# Patient Record
Sex: Female | Born: 1985
Health system: Southern US, Community
[De-identification: ages and names within clinical notes are randomized; demographics above are authoritative.]

---
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
## 2003-03-13 ENCOUNTER — Ambulatory Visit (HOSPITAL_COMMUNITY): Admission: RE | Admit: 2003-03-13 | Discharge: 2003-03-13 | Payer: Self-pay | Admitting: Neurosurgery

## 2003-03-13 ENCOUNTER — Encounter: Payer: Self-pay | Admitting: Neurosurgery

## 2003-04-18 ENCOUNTER — Ambulatory Visit (HOSPITAL_COMMUNITY): Admission: RE | Admit: 2003-04-18 | Discharge: 2003-04-18 | Payer: Self-pay | Admitting: Neurology

## 2003-04-18 ENCOUNTER — Encounter: Payer: Self-pay | Admitting: Neurology

## 2003-04-24 ENCOUNTER — Other Ambulatory Visit: Admission: RE | Admit: 2003-04-24 | Discharge: 2003-04-24 | Payer: Self-pay | Admitting: Family Medicine

## 2004-07-25 ENCOUNTER — Other Ambulatory Visit: Admission: RE | Admit: 2004-07-25 | Discharge: 2004-07-25 | Payer: Self-pay | Admitting: Family Medicine

## 2005-08-11 ENCOUNTER — Encounter: Admission: RE | Admit: 2005-08-11 | Discharge: 2005-08-11 | Payer: Self-pay | Admitting: Family Medicine

## 2006-03-05 ENCOUNTER — Ambulatory Visit (HOSPITAL_BASED_OUTPATIENT_CLINIC_OR_DEPARTMENT_OTHER): Admission: RE | Admit: 2006-03-05 | Discharge: 2006-03-05 | Payer: Self-pay | Admitting: Orthopedic Surgery

## 2006-03-23 ENCOUNTER — Emergency Department (HOSPITAL_COMMUNITY): Admission: EM | Admit: 2006-03-23 | Discharge: 2006-03-23 | Payer: Self-pay | Admitting: Emergency Medicine

## 2010-12-07 ENCOUNTER — Encounter: Payer: Self-pay | Admitting: Family Medicine

## 2018-01-18 DIAGNOSIS — M25562 Pain in left knee: Secondary | ICD-10-CM | POA: Diagnosis not present

## 2018-01-18 DIAGNOSIS — Z4889 Encounter for other specified surgical aftercare: Secondary | ICD-10-CM | POA: Diagnosis not present

## 2018-01-18 DIAGNOSIS — G8929 Other chronic pain: Secondary | ICD-10-CM | POA: Diagnosis not present

## 2018-01-18 DIAGNOSIS — M25662 Stiffness of left knee, not elsewhere classified: Secondary | ICD-10-CM | POA: Diagnosis not present

## 2018-01-18 DIAGNOSIS — M6281 Muscle weakness (generalized): Secondary | ICD-10-CM | POA: Diagnosis not present

## 2018-01-21 DIAGNOSIS — M6281 Muscle weakness (generalized): Secondary | ICD-10-CM | POA: Diagnosis not present

## 2018-01-21 DIAGNOSIS — G8929 Other chronic pain: Secondary | ICD-10-CM | POA: Diagnosis not present

## 2018-01-21 DIAGNOSIS — M25662 Stiffness of left knee, not elsewhere classified: Secondary | ICD-10-CM | POA: Diagnosis not present

## 2018-01-21 DIAGNOSIS — M25562 Pain in left knee: Secondary | ICD-10-CM | POA: Diagnosis not present

## 2018-01-21 DIAGNOSIS — Z4889 Encounter for other specified surgical aftercare: Secondary | ICD-10-CM | POA: Diagnosis not present

## 2018-01-24 DIAGNOSIS — Z4889 Encounter for other specified surgical aftercare: Secondary | ICD-10-CM | POA: Diagnosis not present

## 2018-01-24 DIAGNOSIS — G8929 Other chronic pain: Secondary | ICD-10-CM | POA: Diagnosis not present

## 2018-01-24 DIAGNOSIS — M25662 Stiffness of left knee, not elsewhere classified: Secondary | ICD-10-CM | POA: Diagnosis not present

## 2018-01-24 DIAGNOSIS — M25562 Pain in left knee: Secondary | ICD-10-CM | POA: Diagnosis not present

## 2018-01-24 DIAGNOSIS — M6281 Muscle weakness (generalized): Secondary | ICD-10-CM | POA: Diagnosis not present

## 2018-01-27 DIAGNOSIS — J069 Acute upper respiratory infection, unspecified: Secondary | ICD-10-CM | POA: Diagnosis not present

## 2018-01-28 DIAGNOSIS — M6281 Muscle weakness (generalized): Secondary | ICD-10-CM | POA: Diagnosis not present

## 2018-01-28 DIAGNOSIS — Z4889 Encounter for other specified surgical aftercare: Secondary | ICD-10-CM | POA: Diagnosis not present

## 2018-01-28 DIAGNOSIS — M25562 Pain in left knee: Secondary | ICD-10-CM | POA: Diagnosis not present

## 2018-01-28 DIAGNOSIS — G8929 Other chronic pain: Secondary | ICD-10-CM | POA: Diagnosis not present

## 2018-01-28 DIAGNOSIS — M25662 Stiffness of left knee, not elsewhere classified: Secondary | ICD-10-CM | POA: Diagnosis not present

## 2018-01-30 DIAGNOSIS — J0110 Acute frontal sinusitis, unspecified: Secondary | ICD-10-CM | POA: Diagnosis not present

## 2018-01-31 DIAGNOSIS — M1712 Unilateral primary osteoarthritis, left knee: Secondary | ICD-10-CM | POA: Diagnosis not present

## 2018-02-01 DIAGNOSIS — M25662 Stiffness of left knee, not elsewhere classified: Secondary | ICD-10-CM | POA: Diagnosis not present

## 2018-02-01 DIAGNOSIS — M6281 Muscle weakness (generalized): Secondary | ICD-10-CM | POA: Diagnosis not present

## 2018-02-01 DIAGNOSIS — Z4889 Encounter for other specified surgical aftercare: Secondary | ICD-10-CM | POA: Diagnosis not present

## 2018-02-01 DIAGNOSIS — M25562 Pain in left knee: Secondary | ICD-10-CM | POA: Diagnosis not present

## 2018-02-01 DIAGNOSIS — G8929 Other chronic pain: Secondary | ICD-10-CM | POA: Diagnosis not present

## 2018-02-03 DIAGNOSIS — G90522 Complex regional pain syndrome I of left lower limb: Secondary | ICD-10-CM | POA: Diagnosis not present

## 2018-02-03 DIAGNOSIS — M1712 Unilateral primary osteoarthritis, left knee: Secondary | ICD-10-CM | POA: Diagnosis not present

## 2018-02-03 DIAGNOSIS — Z79891 Long term (current) use of opiate analgesic: Secondary | ICD-10-CM | POA: Diagnosis not present

## 2018-02-18 DIAGNOSIS — M6281 Muscle weakness (generalized): Secondary | ICD-10-CM | POA: Diagnosis not present

## 2018-02-18 DIAGNOSIS — Z4889 Encounter for other specified surgical aftercare: Secondary | ICD-10-CM | POA: Diagnosis not present

## 2018-02-18 DIAGNOSIS — M25562 Pain in left knee: Secondary | ICD-10-CM | POA: Diagnosis not present

## 2018-02-18 DIAGNOSIS — M25662 Stiffness of left knee, not elsewhere classified: Secondary | ICD-10-CM | POA: Diagnosis not present

## 2018-02-18 DIAGNOSIS — G8929 Other chronic pain: Secondary | ICD-10-CM | POA: Diagnosis not present

## 2018-03-14 DIAGNOSIS — G8929 Other chronic pain: Secondary | ICD-10-CM | POA: Diagnosis not present

## 2018-03-14 DIAGNOSIS — M6281 Muscle weakness (generalized): Secondary | ICD-10-CM | POA: Diagnosis not present

## 2018-03-14 DIAGNOSIS — M8580 Other specified disorders of bone density and structure, unspecified site: Secondary | ICD-10-CM | POA: Diagnosis not present

## 2018-03-14 DIAGNOSIS — M25662 Stiffness of left knee, not elsewhere classified: Secondary | ICD-10-CM | POA: Diagnosis not present

## 2018-03-14 DIAGNOSIS — M25562 Pain in left knee: Secondary | ICD-10-CM | POA: Diagnosis not present

## 2018-03-14 DIAGNOSIS — M1712 Unilateral primary osteoarthritis, left knee: Secondary | ICD-10-CM | POA: Diagnosis not present

## 2018-03-14 DIAGNOSIS — Z96652 Presence of left artificial knee joint: Secondary | ICD-10-CM | POA: Diagnosis not present

## 2018-03-14 DIAGNOSIS — Z4889 Encounter for other specified surgical aftercare: Secondary | ICD-10-CM | POA: Diagnosis not present

## 2018-03-24 DIAGNOSIS — Z01419 Encounter for gynecological examination (general) (routine) without abnormal findings: Secondary | ICD-10-CM | POA: Diagnosis not present

## 2018-03-24 DIAGNOSIS — R87610 Atypical squamous cells of undetermined significance on cytologic smear of cervix (ASC-US): Secondary | ICD-10-CM | POA: Diagnosis not present

## 2018-03-24 DIAGNOSIS — R69 Illness, unspecified: Secondary | ICD-10-CM | POA: Diagnosis not present

## 2018-03-25 DIAGNOSIS — R030 Elevated blood-pressure reading, without diagnosis of hypertension: Secondary | ICD-10-CM | POA: Diagnosis not present

## 2018-03-25 DIAGNOSIS — Z01419 Encounter for gynecological examination (general) (routine) without abnormal findings: Secondary | ICD-10-CM | POA: Diagnosis not present

## 2018-03-25 DIAGNOSIS — Z124 Encounter for screening for malignant neoplasm of cervix: Secondary | ICD-10-CM | POA: Diagnosis not present

## 2018-03-25 DIAGNOSIS — Z3041 Encounter for surveillance of contraceptive pills: Secondary | ICD-10-CM | POA: Diagnosis not present

## 2018-03-25 DIAGNOSIS — R69 Illness, unspecified: Secondary | ICD-10-CM | POA: Diagnosis not present

## 2018-04-01 DIAGNOSIS — Z4889 Encounter for other specified surgical aftercare: Secondary | ICD-10-CM | POA: Diagnosis not present

## 2018-04-01 DIAGNOSIS — M25662 Stiffness of left knee, not elsewhere classified: Secondary | ICD-10-CM | POA: Diagnosis not present

## 2018-04-01 DIAGNOSIS — M6281 Muscle weakness (generalized): Secondary | ICD-10-CM | POA: Diagnosis not present

## 2018-04-01 DIAGNOSIS — G8929 Other chronic pain: Secondary | ICD-10-CM | POA: Diagnosis not present

## 2018-04-01 DIAGNOSIS — M25562 Pain in left knee: Secondary | ICD-10-CM | POA: Diagnosis not present

## 2018-04-04 DIAGNOSIS — M8580 Other specified disorders of bone density and structure, unspecified site: Secondary | ICD-10-CM | POA: Diagnosis not present

## 2018-04-04 DIAGNOSIS — Z96652 Presence of left artificial knee joint: Secondary | ICD-10-CM | POA: Diagnosis not present

## 2018-04-04 DIAGNOSIS — M1712 Unilateral primary osteoarthritis, left knee: Secondary | ICD-10-CM | POA: Diagnosis not present

## 2018-04-04 DIAGNOSIS — S86812S Strain of other muscle(s) and tendon(s) at lower leg level, left leg, sequela: Secondary | ICD-10-CM | POA: Diagnosis not present

## 2018-04-22 DIAGNOSIS — Z0000 Encounter for general adult medical examination without abnormal findings: Secondary | ICD-10-CM | POA: Diagnosis not present

## 2018-04-22 DIAGNOSIS — Z13220 Encounter for screening for lipoid disorders: Secondary | ICD-10-CM | POA: Diagnosis not present

## 2018-04-22 DIAGNOSIS — Z720 Tobacco use: Secondary | ICD-10-CM | POA: Diagnosis not present

## 2018-04-22 DIAGNOSIS — Z131 Encounter for screening for diabetes mellitus: Secondary | ICD-10-CM | POA: Diagnosis not present

## 2018-05-11 DIAGNOSIS — M1712 Unilateral primary osteoarthritis, left knee: Secondary | ICD-10-CM | POA: Diagnosis not present

## 2018-06-13 DIAGNOSIS — Z720 Tobacco use: Secondary | ICD-10-CM | POA: Diagnosis not present

## 2018-06-13 DIAGNOSIS — R69 Illness, unspecified: Secondary | ICD-10-CM | POA: Diagnosis not present

## 2018-06-13 DIAGNOSIS — F439 Reaction to severe stress, unspecified: Secondary | ICD-10-CM | POA: Diagnosis not present

## 2018-06-14 DIAGNOSIS — S86212A Strain of muscle(s) and tendon(s) of anterior muscle group at lower leg level, left leg, initial encounter: Secondary | ICD-10-CM | POA: Diagnosis not present

## 2018-06-14 DIAGNOSIS — M1712 Unilateral primary osteoarthritis, left knee: Secondary | ICD-10-CM | POA: Diagnosis not present

## 2018-06-14 DIAGNOSIS — M25562 Pain in left knee: Secondary | ICD-10-CM | POA: Diagnosis not present

## 2018-06-14 DIAGNOSIS — Z96652 Presence of left artificial knee joint: Secondary | ICD-10-CM | POA: Diagnosis not present

## 2018-06-15 DIAGNOSIS — M1712 Unilateral primary osteoarthritis, left knee: Secondary | ICD-10-CM | POA: Diagnosis not present

## 2018-06-15 DIAGNOSIS — M66252 Spontaneous rupture of extensor tendons, left thigh: Secondary | ICD-10-CM | POA: Diagnosis not present

## 2018-06-27 DIAGNOSIS — G90522 Complex regional pain syndrome I of left lower limb: Secondary | ICD-10-CM | POA: Diagnosis not present

## 2018-06-27 DIAGNOSIS — M1712 Unilateral primary osteoarthritis, left knee: Secondary | ICD-10-CM | POA: Diagnosis not present

## 2018-06-27 DIAGNOSIS — Z79891 Long term (current) use of opiate analgesic: Secondary | ICD-10-CM | POA: Diagnosis not present

## 2018-07-19 DIAGNOSIS — R69 Illness, unspecified: Secondary | ICD-10-CM | POA: Diagnosis not present

## 2018-07-19 DIAGNOSIS — Z720 Tobacco use: Secondary | ICD-10-CM | POA: Diagnosis not present

## 2018-07-19 DIAGNOSIS — F439 Reaction to severe stress, unspecified: Secondary | ICD-10-CM | POA: Diagnosis not present

## 2018-07-20 ENCOUNTER — Other Ambulatory Visit (HOSPITAL_BASED_OUTPATIENT_CLINIC_OR_DEPARTMENT_OTHER): Payer: Self-pay | Admitting: Physician Assistant

## 2018-07-20 DIAGNOSIS — S86812D Strain of other muscle(s) and tendon(s) at lower leg level, left leg, subsequent encounter: Secondary | ICD-10-CM

## 2018-07-23 ENCOUNTER — Ambulatory Visit (HOSPITAL_BASED_OUTPATIENT_CLINIC_OR_DEPARTMENT_OTHER)
Admission: RE | Admit: 2018-07-23 | Discharge: 2018-07-23 | Disposition: A | Discharge status: Home / Self Care | Payer: 59 | Source: Ambulatory Visit | Attending: Physician Assistant | Admitting: Physician Assistant

## 2018-07-23 DIAGNOSIS — Z9889 Other specified postprocedural states: Secondary | ICD-10-CM | POA: Diagnosis not present

## 2018-07-23 DIAGNOSIS — S86812D Strain of other muscle(s) and tendon(s) at lower leg level, left leg, subsequent encounter: Secondary | ICD-10-CM | POA: Diagnosis not present

## 2018-07-23 DIAGNOSIS — M25562 Pain in left knee: Secondary | ICD-10-CM | POA: Diagnosis not present

## 2018-08-08 DIAGNOSIS — G90522 Complex regional pain syndrome I of left lower limb: Secondary | ICD-10-CM | POA: Diagnosis not present

## 2018-08-08 DIAGNOSIS — Z79891 Long term (current) use of opiate analgesic: Secondary | ICD-10-CM | POA: Diagnosis not present

## 2018-08-08 DIAGNOSIS — M1712 Unilateral primary osteoarthritis, left knee: Secondary | ICD-10-CM | POA: Diagnosis not present

## 2018-09-05 DIAGNOSIS — Z79891 Long term (current) use of opiate analgesic: Secondary | ICD-10-CM | POA: Diagnosis not present

## 2018-09-05 DIAGNOSIS — M1712 Unilateral primary osteoarthritis, left knee: Secondary | ICD-10-CM | POA: Diagnosis not present

## 2018-09-05 DIAGNOSIS — G90522 Complex regional pain syndrome I of left lower limb: Secondary | ICD-10-CM | POA: Diagnosis not present

## 2018-09-29 DIAGNOSIS — M25562 Pain in left knee: Secondary | ICD-10-CM | POA: Diagnosis not present

## 2018-10-03 DIAGNOSIS — M25569 Pain in unspecified knee: Secondary | ICD-10-CM | POA: Diagnosis not present

## 2018-10-03 DIAGNOSIS — M25562 Pain in left knee: Secondary | ICD-10-CM | POA: Diagnosis not present

## 2018-10-27 DIAGNOSIS — T8484XA Pain due to internal orthopedic prosthetic devices, implants and grafts, initial encounter: Secondary | ICD-10-CM | POA: Diagnosis not present

## 2018-10-27 DIAGNOSIS — M25562 Pain in left knee: Secondary | ICD-10-CM | POA: Diagnosis not present

## 2018-12-23 DIAGNOSIS — J1008 Influenza due to other identified influenza virus with other specified pneumonia: Secondary | ICD-10-CM | POA: Diagnosis not present

## 2018-12-26 DIAGNOSIS — G90522 Complex regional pain syndrome I of left lower limb: Secondary | ICD-10-CM | POA: Diagnosis not present

## 2018-12-26 DIAGNOSIS — M1712 Unilateral primary osteoarthritis, left knee: Secondary | ICD-10-CM | POA: Diagnosis not present

## 2019-01-08 IMAGING — MR MR KNEE*L* W/O CM
6 series · 40 of 40 positions shown · non-contrast
Comparison: None available.

CLINICAL DATA: Chronic left knee pain. History of multiple knee
surgeries.

EXAM:
MRI OF THE LEFT KNEE WITHOUT CONTRAST
TECHNIQUE: Multiplanar, multisequence MR imaging of the knee was performed. No
intravenous contrast was administered.

[Series 5: T1 · axial · 4.0mm · 0.47mm/px · z∈[-100,+65]mm · 7 of 34 slices shown]
[im 1/34]
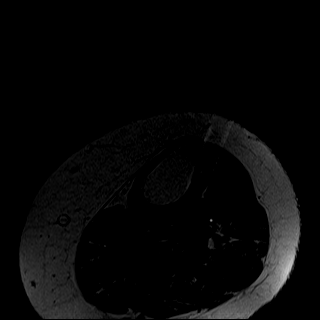
[im 6/34]
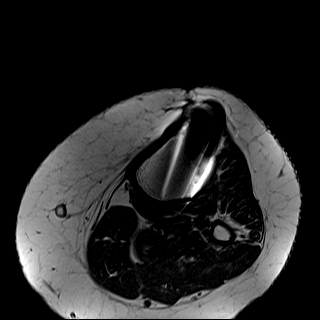
[im 12/34]
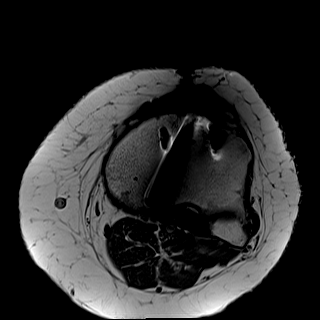
[im 17/34]
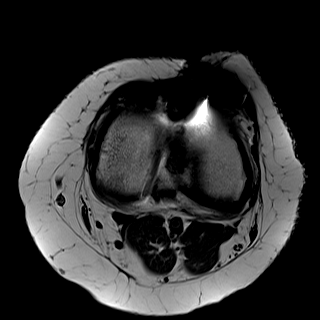
[im 23/34]
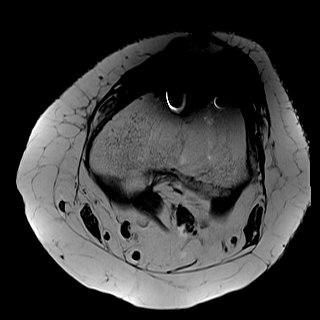
[im 28/34]
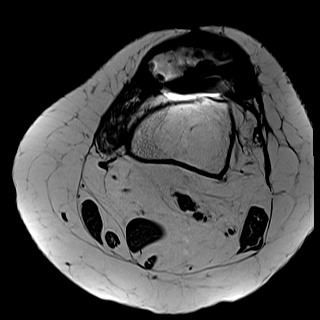
[im 34/34]
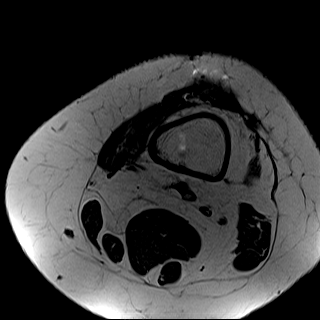

[Series 6: t2_tse_tra_high_bw · axial · 4.0mm · 0.39mm/px · z∈[-100,+65]mm · 7 of 34 slices shown]
[im 1/34]
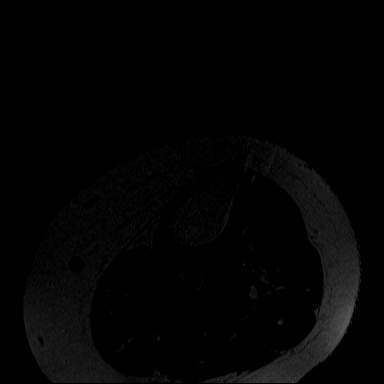
[im 6/34]
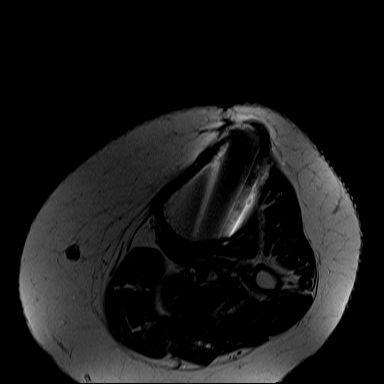
[im 12/34]
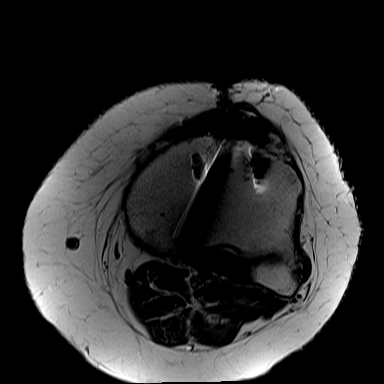
[im 17/34]
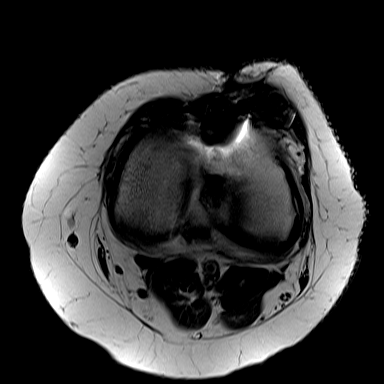
[im 23/34]
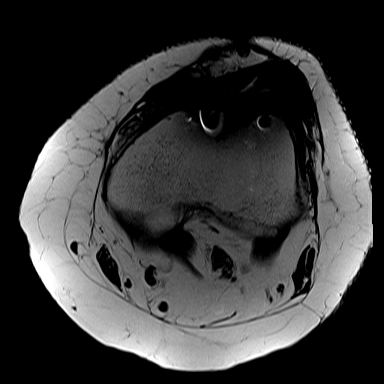
[im 28/34]
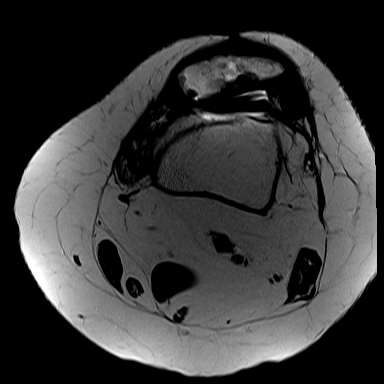
[im 34/34]
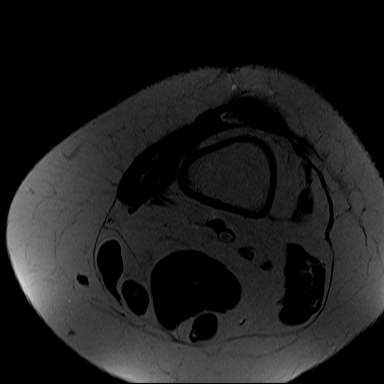

[Series 7: t1_tirm_cor_high_bw · coronal · 3.0mm · 0.53mm/px · 6 of 28 slices shown]
[im 1/28]
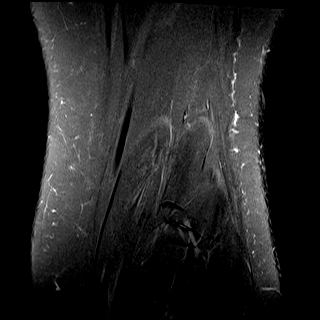
[im 6/28]
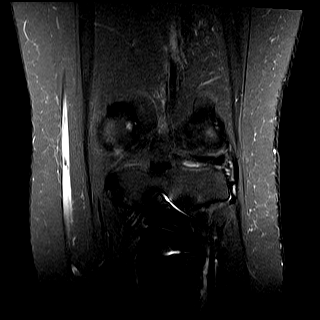
[im 11/28]
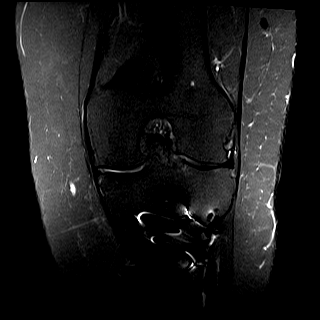
[im 17/28]
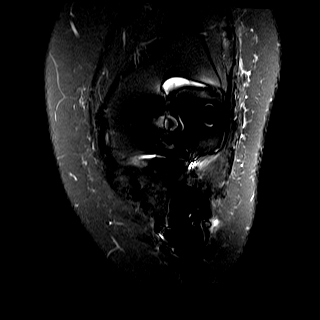
[im 22/28]
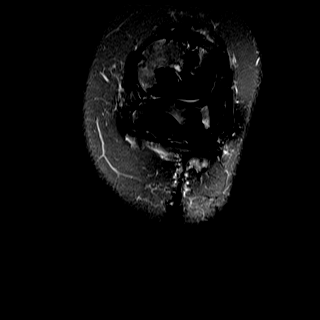
[im 28/28]
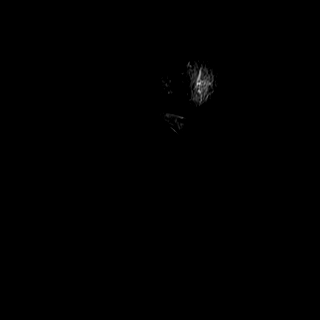

[Series 8: t1_tirm_tra_high_bw · axial · 4.0mm · 0.47mm/px · z∈[-100,+65]mm · 7 of 34 slices shown]
[im 1/34]
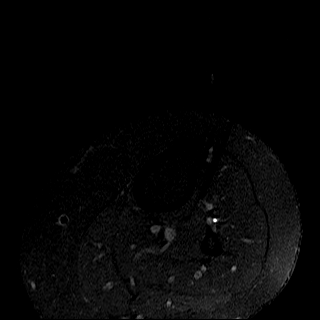
[im 6/34]
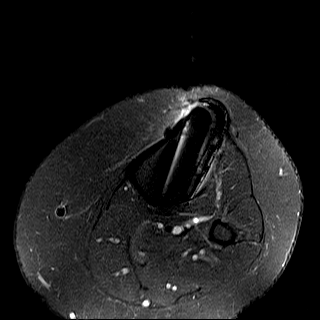
[im 12/34]
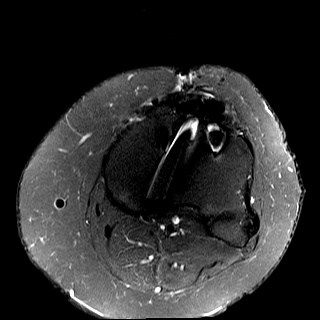
[im 17/34]
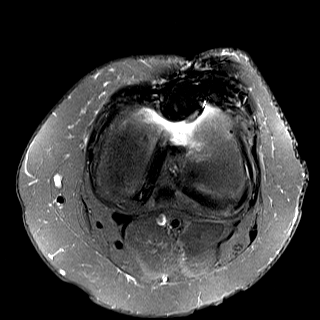
[im 23/34]
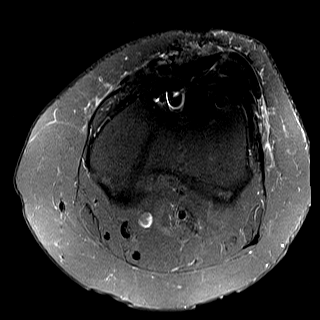
[im 28/34]
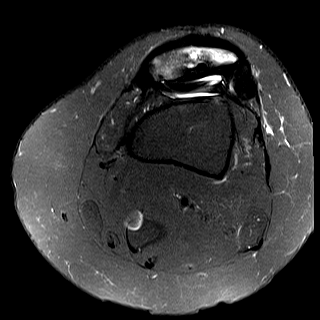
[im 34/34]
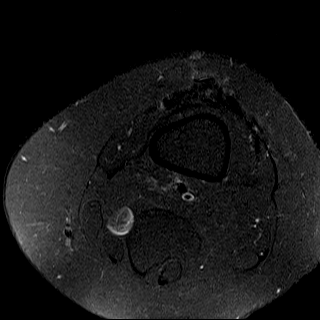

[Series 9: t1_tse_cor_high_bw · coronal · 3.0mm · 0.53mm/px · 6 of 28 slices shown]
[im 1/28]
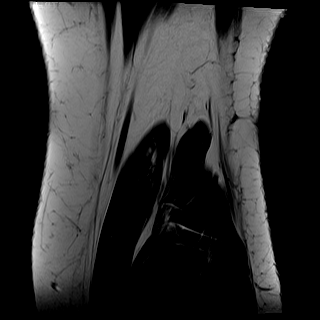
[im 6/28]
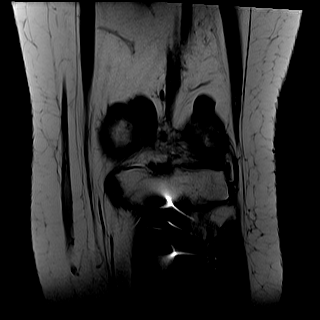
[im 11/28]
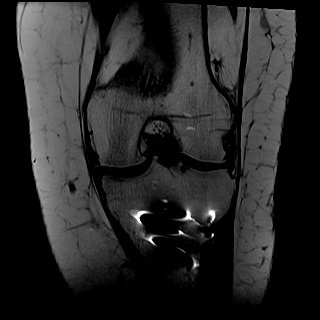
[im 17/28]
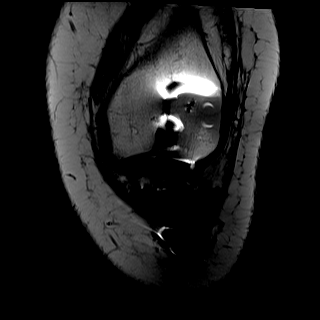
[im 22/28]
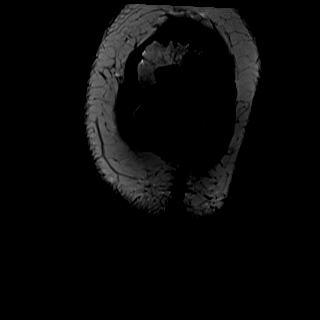
[im 28/28]
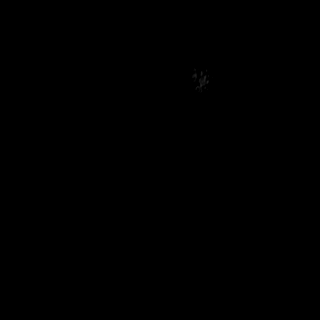

[Series 10: t2_tse_sag_high_bw · sagittal · 3.0mm · 0.47mm/px · 7 of 32 slices shown]
[im 1/32]
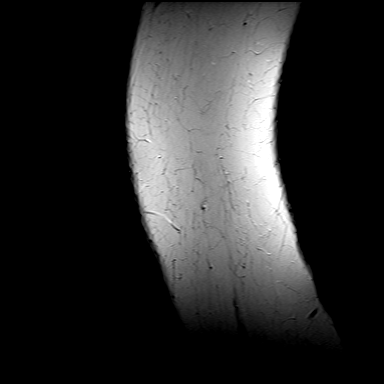
[im 6/32]
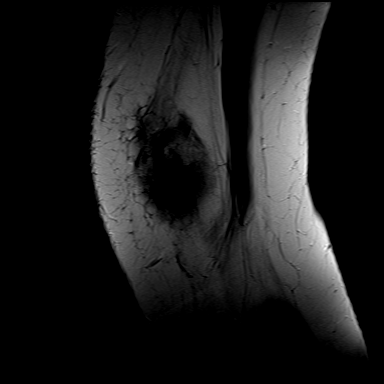
[im 11/32]
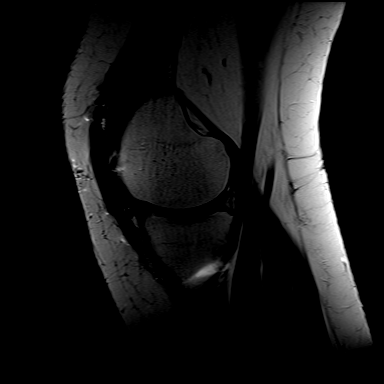
[im 16/32]
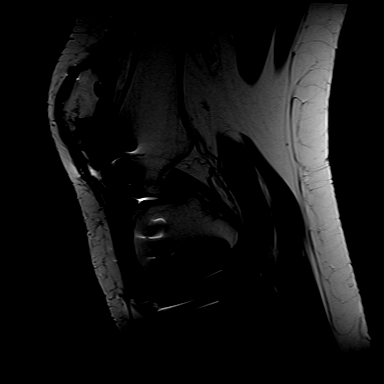
[im 21/32]
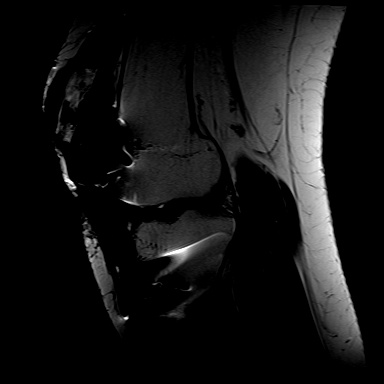
[im 26/32]
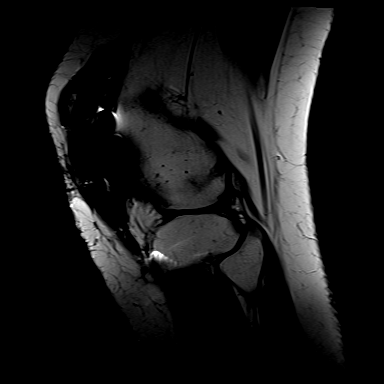
[im 32/32]
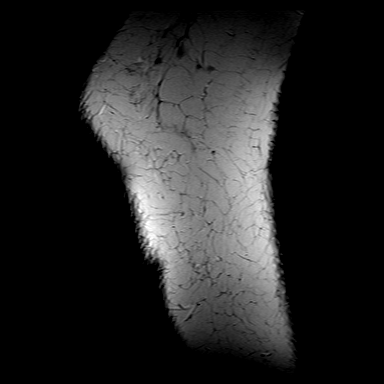

[40 of 40 positions shown; findings below may reference images not displayed]

FINDINGS: MENISCI

Medial meniscus: Suspect near complete medial meniscectomy. I see
only a small ridge of remnant meniscal tissue.

Lateral meniscus: Probable prior partial lateral meniscectomy
without obvious recurrent tear.

LIGAMENTS

Cruciates:  Intact

Collaterals:  Intact

CARTILAGE

Patellofemoral:  Suspect patellofemoral implant.

Medial: Advanced degenerative chondrosis for age with marked
cartilage thinning, joint space narrowing and spurring.

Lateral: Moderate to advanced degenerative chondrosis with joint
space narrowing and spurring.

Joint:  No joint effusion.

Popliteal Fossa:  No popliteal mass or Baker's cyst.

Extensor Mechanism: Extensive scarring changes involving the
retinaculum and the distal quadriceps and the entire patellar tendon
which may possibly contains some type of graft material. Is also
extensive scarring and Hoffa's fat. I do not see an obvious read
rupture. Significant artifact related to metallic hardware.

Bones:  No acute bony findings.

Other: Moderate atrophy of the knee musculature.
IMPRESSION: 1. Extensive postoperative changes involving the patellofemoral unit
with possible graft material as part of the patellar tendon. There
is extensive fibrotic scar tissue surrounding the patella, patellar
tendon and in Hoffa's fat.
2. Patellofemoral implants without obvious complicating features.
3. Evidence of partial medial and lateral meniscectomies. No obvious
recurrent tears.
4. Intact ligamentous structures.

## 2019-01-13 DIAGNOSIS — E538 Deficiency of other specified B group vitamins: Secondary | ICD-10-CM | POA: Diagnosis not present

## 2019-01-13 DIAGNOSIS — G479 Sleep disorder, unspecified: Secondary | ICD-10-CM | POA: Diagnosis not present

## 2019-01-13 DIAGNOSIS — R5383 Other fatigue: Secondary | ICD-10-CM | POA: Diagnosis not present

## 2019-01-13 DIAGNOSIS — E559 Vitamin D deficiency, unspecified: Secondary | ICD-10-CM | POA: Diagnosis not present

## 2019-01-13 DIAGNOSIS — R69 Illness, unspecified: Secondary | ICD-10-CM | POA: Diagnosis not present

## 2023-04-07 ENCOUNTER — Telehealth: Payer: Self-pay | Admitting: Nurse Practitioner

## 2023-04-07 NOTE — Telephone Encounter (Signed)
scheduled per referral, pt has been called and confirmed date and time. Pt is aware of location and to arrive early for check in   

## 2023-05-03 ENCOUNTER — Encounter: Payer: Self-pay | Admitting: Nurse Practitioner

## 2023-05-03 ENCOUNTER — Other Ambulatory Visit: Payer: Self-pay

## 2023-05-03 ENCOUNTER — Inpatient Hospital Stay: Payer: BC Managed Care – PPO | Attending: Nurse Practitioner

## 2023-05-03 ENCOUNTER — Inpatient Hospital Stay (HOSPITAL_BASED_OUTPATIENT_CLINIC_OR_DEPARTMENT_OTHER): Payer: BC Managed Care – PPO | Admitting: Nurse Practitioner

## 2023-05-03 VITALS — BP 137/90 | HR 91 | Temp 98.3°F | Resp 14 | Wt 215.4 lb

## 2023-05-03 DIAGNOSIS — D72829 Elevated white blood cell count, unspecified: Secondary | ICD-10-CM

## 2023-05-03 DIAGNOSIS — F17210 Nicotine dependence, cigarettes, uncomplicated: Secondary | ICD-10-CM | POA: Diagnosis not present

## 2023-05-03 DIAGNOSIS — D72820 Lymphocytosis (symptomatic): Secondary | ICD-10-CM

## 2023-05-03 LAB — CBC WITH DIFFERENTIAL (CANCER CENTER ONLY)
Abs Immature Granulocytes: 0.04 10*3/uL (ref 0.00–0.07)
Basophils Absolute: 0.1 10*3/uL (ref 0.0–0.1)
Basophils Relative: 0 %
Eosinophils Absolute: 0.4 10*3/uL (ref 0.0–0.5)
Eosinophils Relative: 3 %
HCT: 44.3 % (ref 36.0–46.0)
Hemoglobin: 15.2 g/dL — ABNORMAL HIGH (ref 12.0–15.0)
Immature Granulocytes: 0 %
Lymphocytes Relative: 41 %
Lymphs Abs: 5.3 10*3/uL — ABNORMAL HIGH (ref 0.7–4.0)
MCH: 30.6 pg (ref 26.0–34.0)
MCHC: 34.3 g/dL (ref 30.0–36.0)
MCV: 89.1 fL (ref 80.0–100.0)
Monocytes Absolute: 0.7 10*3/uL (ref 0.1–1.0)
Monocytes Relative: 5 %
Neutro Abs: 6.4 10*3/uL (ref 1.7–7.7)
Neutrophils Relative %: 51 %
Platelet Count: 364 10*3/uL (ref 150–400)
RBC: 4.97 MIL/uL (ref 3.87–5.11)
RDW: 12.9 % (ref 11.5–15.5)
WBC Count: 12.9 10*3/uL — ABNORMAL HIGH (ref 4.0–10.5)
nRBC: 0 % (ref 0.0–0.2)

## 2023-05-03 LAB — TECHNOLOGIST SMEAR REVIEW: Plt Morphology: NORMAL

## 2023-05-03 LAB — LACTATE DEHYDROGENASE: LDH: 133 U/L (ref 98–192)

## 2023-05-03 NOTE — Progress Notes (Unsigned)
[facility]   [phone] [phone]   Clinic New consult Note   Patient Care Team: Shirlyn Goltz (Inactive) as PCP - General (Physician Assistant) Date of Service: 05/03/2023  CHIEF COMPLAINTS/PURPOSE OF CONSULTATION:  Elevated WBC, lymphocytosis, referred by PCP Grace Bushy, NP  HISTORY OF PRESENTING ILLNESS:  Sara Massey 37 y.o. female with PMH including vitamin D deficiency, high cholesterol panel, multiple orthopedic surgeries (19 total since 2007, last surgery in 2022), and h/o MRSA infection (shoulder 2009) is here because of elevated white blood cell and lymphocyte count.  Blood work at new PCP 04/05/2023 showed WBC 12.3, absolute lymphocytes 5.1, otherwise normal CBC.  CBCs prior to that in 01/13/2021 showed WBC 13.3, absolute lymphs 5.3 and 09/17/2010 showed WBC 10.0 with normal lymphocytes. She denies chronic or recurrent infections. There was a period many years ago where she was thought to have an infection in the knee but multiple studies at [facility] were inconclusive. She is a some day smoker, up to 1/4 PPD for 17 years. Denies steroid use.   Socially, she is married, no children.  She works in Educational psychologist.  Independent with ADLs.  Denies alcohol or drug use.  Denies family history of abnormal blood count, blood disorder, or cancer.  She is up-to-date on pelvic/Pap smear.  Today she presents by herself.  She is fatigued, gets 4-5 hours of sleep at night due to chronic knee pain, on trazodone.  Denies unintentional weight loss, fever, night sweats, nausea/vomiting, constipation or diarrhea, abdominal pain/bloating, bloody stools, or any other specific complaints.    MEDICAL HISTORY:  History reviewed. No pertinent past medical history.  SURGICAL HISTORY: History reviewed. No pertinent surgical history.  SOCIAL HISTORY: Social History   Socioeconomic History   Marital status: Married    Spouse name: Not on file   Number of  children: Not on file   Years of education: Not on file   Highest education level: Not on file  Occupational History   Not on file  Tobacco Use   Smoking status: Some Days    Packs/day: 0.25    Years: 17.00    Additional pack years: 0.00    Total pack years: 4.25    Types: Cigarettes   Smokeless tobacco: Never  Substance and Sexual Activity   Alcohol use: Not Currently   Drug use: Not Currently   Sexual activity: Not on file  Other Topics Concern   Not on file  Social History Narrative   Not on file   Social Determinants of Health   Financial Resource Strain: Not on file  Food Insecurity: No Food Insecurity (05/03/2023)   Hunger Vital Sign    Worried About Running Out of Food in the Last Year: Never true    Ran Out of Food in the Last Year: Never true  Transportation Needs: No Transportation Needs (05/03/2023)   PRAPARE - Administrator, Civil Service (Medical): No    Lack of Transportation (Non-Medical): No  Physical Activity: Not on file  Stress: Not on file  Social Connections: Not on file  Intimate Partner Violence: Not At Risk (05/03/2023)   Humiliation, Afraid, Rape, and Kick questionnaire    Fear of Current or Ex-Partner: No    Emotionally Abused: No    Physically Abused: No    Sexually Abused: No    FAMILY HISTORY: History reviewed. No pertinent family history.  ALLERGIES:  has no allergies on file.  MEDICATIONS:  Current Outpatient  Medications  Medication Sig Dispense Refill   levonorgestrel-ethinyl estradiol (SEASONALE) 0.15-0.03 MG tablet Take 1 tablet by mouth daily.     methocarbamol (ROBAXIN) 500 MG tablet Take 500 mg by mouth 4 (four) times daily.     traMADol (ULTRAM) 50 MG tablet Take 50 mg by mouth 4 (four) times daily.     traMADol (ULTRAM-ER) 200 MG 24 hr tablet Take 200 mg by mouth daily.     traZODone (DESYREL) 100 MG tablet Take 1 tablet by mouth at bedtime as needed.     No current facility-administered medications for this  visit.    REVIEW OF SYSTEMS:   Constitutional: Denies fevers, chills, abnormal night sweats or unintentional weight loss (+) fatigue Eyes: Denies blurriness of vision, double vision or watery eyes Ears, nose, mouth, throat, and face: Denies mucositis or sore throat Respiratory: Denies cough, dyspnea or wheezes Cardiovascular: Denies palpitation, chest discomfort or lower extremity swelling Gastrointestinal:  Denies nausea, heartburn or change in bowel habits Skin: Denies abnormal skin rashes Lymphatics: Denies new lymphadenopathy or easy bruising MSK: (+) Multiple knee surgeries chronic pain Neurological: Denies numbness, tingling or new weaknesses Behavioral/Psych: Mood is stable, no new changes  All other systems were reviewed with the patient and are negative.  PHYSICAL EXAMINATION: ECOG PERFORMANCE STATUS: 0 - Asymptomatic  Vitals:   05/03/23 1235  BP: (!) 137/90  Pulse: 91  Resp: 14  Temp: 98.3 F (36.8 C)  SpO2: 97%   Filed Weights   05/03/23 1235  Weight: 215 lb 6.4 oz (97.7 kg)    GENERAL:alert, no distress and comfortable SKIN: no rashes or significant lesions EYES: sclera clear NECK: without mass LYMPH:  no palpable cervical, supraclavicular, or axillary  LUNGS: clear with normal breathing effort HEART: regular rate & rhythm, no lower extremity edema ABDOMEN:abdomen soft, non-tender and normal bowel sounds Musculoskeletal:no cyanosis of digits and no clubbing  PSYCH: alert & oriented x 3 with fluent speech NEURO: no focal motor/sensory deficits  LABORATORY DATA:  I have reviewed the data as listed    Latest Ref Rng & Units 05/03/2023    1:48 PM  CBC  WBC 4.0 - 10.5 K/uL 12.9   Hemoglobin 12.0 - 15.0 g/dL 40.1   Hematocrit 02.7 - 46.0 % 44.3   Platelets 150 - 400 K/uL 364      RADIOGRAPHIC STUDIES: I have personally reviewed the radiological images as listed and agreed with the findings in the report. No results found.  ASSESSMENT & PLAN: 37 yo  female   Leukocytosis, lymphocytosis  -We reviewed her medical record in detail with the patient. She has had mild leukocytosis with intermittent lymphocytosis since at least 2011, but only a few CBCs in the system.  -We reviewed common etiologies especially benign/reactive to smoking, infection/inflammation/allergy (she has had 19 knee surgeries over 17 years), or other less common such as CLL/SLL or lymphoma.  -Sara Massey appears well, she has no B-symptoms, exam is benign; no adenopathy.  -Repeat lab today shows WBC 12.9, abs Lymphs 5.3 which is her baseline. Hgb 15.2 (?dehydration/fasting, smoking). LDH is normal. Peripheral smear shows unremarkable morphology. Will wait for flow, to see if she needs further evaluation. I will call her with results  -If work up is negative, she likely does not need to f/up with [place].  -Pt seen with Dr. Mosetta Putt   Multiple orthopedic surgeries, chronic pain -Per [facility] and pain management   Smoking -She smokes </= 1/4 PPD x17 years. I reviewed smoking can cause leukocytosis.  I encouraged her to quit  Age appropriate health maintenance  -She is up to date on pelvic/pap smear; encouraged to begin other age appropriate cancer screenings when eligible     PLAN: -Medical record reviewed -Lab today, will call with results -F/up pending   Orders Placed This Encounter  Procedures   CBC with Differential (Cancer Center Only)    Standing Status:   Future    Number of Occurrences:   1    Standing Expiration Date:   05/02/2024   Flow Cytometry, Peripheral Blood (Oncology)    Small B cell panel    Standing Status:   Future    Number of Occurrences:   1    Standing Expiration Date:   05/02/2024   Lactate dehydrogenase (LDH)    Standing Status:   Future    Number of Occurrences:   1    Standing Expiration Date:   05/02/2024   Technologist smear review    Order Specific Question:   Clinical information:    Answer:   lymphocytosis    No problem-specific  Assessment & Plan notes found for this encounter.    All questions were answered. The patient knows to call the clinic with any problems, questions or concerns.     Sara Samples, NP 05/04/23   Addendum I have seen the patient, examined her. I agree with the assessment and and plan and have edited the notes.   37 yo female with PMH of multiple orthopedic surgeries, was referred for incidental finding of leukocytosis, with predominant lymphocytes on routine lab.  She is asymptomatic.  We discussed possible etiology of leukocytosis, especially reactive leukocytosis Such as virus infection, stress, smoking, allergy reaction etc.  Monoclonal lymphocytosis, such as CLL/SLL, or B-cell lymphoma are less likely, given her young age, negative clinical exam and otherwise asymptomatic lymphocytosis.  I recommend repeat CBC and differential, will review her peripheral blood smear, and obtain small B-cell flow cytometry for further evaluation.  Will call her with lab results next week, to see if she needs additional test such as CT if monoclonal B-cell lymphocytosis is seen on flow. All questions were answered.   Malachy Mood MD 05/03/2023

## 2023-05-04 ENCOUNTER — Encounter: Payer: Self-pay | Admitting: Nurse Practitioner

## 2023-05-04 LAB — SURGICAL PATHOLOGY

## 2023-05-05 LAB — FLOW CYTOMETRY

## 2023-05-10 ENCOUNTER — Other Ambulatory Visit: Payer: Self-pay | Admitting: Nurse Practitioner

## 2023-05-10 ENCOUNTER — Telehealth: Payer: Self-pay | Admitting: Nurse Practitioner

## 2023-05-10 DIAGNOSIS — D72820 Lymphocytosis (symptomatic): Secondary | ICD-10-CM

## 2023-05-10 NOTE — Telephone Encounter (Signed)
I called pt to review her work up, including peripheral smear, LDH, and flow which were all unremarkable. Leuko/lymphocytosis are likely benign/reactive to multiple surgeries, smoking, etc. We are not recommending further work up. Repeat CBC in 6 and 12 months, if stable can f/up with PCP in the future. She had no other questions and appreciated the call.   Will fax this note to PCP  Santiago Glad, NP

## 2023-05-11 ENCOUNTER — Other Ambulatory Visit: Payer: Self-pay

## 2023-05-11 ENCOUNTER — Telehealth: Payer: Self-pay

## 2023-05-11 NOTE — Telephone Encounter (Signed)
Thayer Ohm with [facility] in [place] returned Sharlette Dense, CMA call on 05/10/2023.  Chris LVM message stating their telephone number [phone] and fax number [phone] for Jonny Ruiz to reply to their VM.  Notified Sharlette Dense, CMA.

## 2023-05-12 ENCOUNTER — Telehealth: Payer: Self-pay

## 2023-05-12 NOTE — Telephone Encounter (Signed)
-----   Message from Pollyann Samples, NP sent at 05/10/2023  2:45 PM EDT ----- Regarding: RE: Jonny Ruiz, I put in a short phone note today, could you please fax it to her PCP Grace Bushy, FNP? I couldn't attach in epic.  Chelsea, could you please schedule lab in 6 and 12 months, then f/up with me in 12 months.  Thanks, Clayborn Heron  ----- Message ----- From: Malachy Mood, MD Sent: 05/07/2023   3:48 PM EDT To: Pollyann Samples, NP Subject: RE:                                            Agree, thanks ----- Message ----- From: Pollyann Samples, NP Sent: 05/06/2023   5:18 PM EDT To: Malachy Mood, MD  Her smear, LDH, and flow are unremarkable, leuko/lymphocytosis are likely benign/reactive to multiple surgeries, smoking, etc. No further work up.   If you agree I will let pt know  Thanks

## 2023-05-12 NOTE — Telephone Encounter (Signed)
Faxed office note as per Santiago Glad to Grace Bushy, FNP at [facility]. Received fax confirmation of receipt.

## 2023-10-18 ENCOUNTER — Inpatient Hospital Stay: Payer: Self-pay | Attending: Hematology

## 2024-04-23 ENCOUNTER — Other Ambulatory Visit: Payer: Self-pay | Admitting: Nurse Practitioner

## 2024-04-23 DIAGNOSIS — D72820 Lymphocytosis (symptomatic): Secondary | ICD-10-CM

## 2024-04-23 NOTE — Assessment & Plan Note (Deleted)
-  We reviewed her medical record in detail with the patient. She has had mild leukocytosis with intermittent lymphocytosis since at least 2011, but only a few CBCs in the system.  -We reviewed common etiologies especially benign/reactive to smoking, infection/inflammation/allergy (she has had 19 knee surgeries over 17 years), or other less common such as CLL/SLL or lymphoma.  -Sara Massey appears well, she has no B-symptoms, exam is benign; no adenopathy.  -Repeat lab today shows WBC 12.9, abs Lymphs 5.3 which is her baseline. Hgb 15.2 (?dehydration/fasting, smoking). LDH is normal. Peripheral smear shows unremarkable morphology. Will wait for flow, to see if she needs further evaluation. I will call her with results  -If work up is negative, she likely does not need to f/up with us .

## 2024-04-23 NOTE — Progress Notes (Deleted)
 Patient Care Team: Wright, Kristen M, PA-C (Inactive) as PCP - General (Physician Assistant)  Clinic Day:  04/23/2024  Referring physician: No ref. provider found  ASSESSMENT & PLAN:   Assessment & Plan: Lymphocytosis -We reviewed her medical record in detail with the patient. She has had mild leukocytosis with intermittent lymphocytosis since at least 2011, but only a few CBCs in the system.  -We reviewed common etiologies especially benign/reactive to smoking, infection/inflammation/allergy (she has had 19 knee surgeries over 17 years), or other less common such as CLL/SLL or lymphoma.  -Sara Massey appears well, she has no B-symptoms, exam is benign; no adenopathy.  -Repeat lab today shows WBC 12.9, abs Lymphs 5.3 which is her baseline. Hgb 15.2 (?dehydration/fasting, smoking). LDH is normal. Peripheral smear shows unremarkable morphology. Will wait for flow, to see if she needs further evaluation. I will call her with results  -If work up is negative, she likely does not need to f/up with us .     The patient understands the plans discussed today and is in agreement with them.  She knows to contact our office if she develops concerns prior to her next appointment.  I provided *** minutes of face-to-face time during this encounter and > 50% was spent counseling as documented under my assessment and plan.    Sharyon Deis, NP  [facility] - A DEPT OF [facility] [address] [place] [address] Dept: [phone] Dept Fax: [phone]   No orders of the defined types were placed in this encounter.     CHIEF COMPLAINT:  CC: Lymphocytosis  Current Treatment: Monitoring  INTERVAL HISTORY:  Sara Massey is here today for repeat clinical assessment.  Initial assessment performed  in June 2024 by Lacie, NP.  Workup was unremarkable and it was believed that leukocytosis/lymphocytosis is reactive, related to multiple  recent surgeries.  She denies fevers or chills. She denies pain. Her appetite is good. Her weight {Weight change:10426}.  I have reviewed the past medical history, past surgical history, social history and family history with the patient and they are unchanged from previous note.  ALLERGIES:  has no allergies on file.  MEDICATIONS:  Current Outpatient Medications  Medication Sig Dispense Refill   levonorgestrel-ethinyl estradiol (SEASONALE) 0.15-0.03 MG tablet Take 1 tablet by mouth daily.     methocarbamol (ROBAXIN) 500 MG tablet Take 500 mg by mouth 4 (four) times daily.     traMADol (ULTRAM) 50 MG tablet Take 50 mg by mouth 4 (four) times daily.     traMADol (ULTRAM-ER) 200 MG 24 hr tablet Take 200 mg by mouth daily.     traZODone (DESYREL) 100 MG tablet Take 1 tablet by mouth at bedtime as needed.     No current facility-administered medications for this visit.    HISTORY OF PRESENT ILLNESS:   Oncology History   No history exists.      REVIEW OF SYSTEMS:   Constitutional: Denies fevers, chills or abnormal weight loss Eyes: Denies blurriness of vision Ears, nose, mouth, throat, and face: Denies mucositis or sore throat Respiratory: Denies cough, dyspnea or wheezes Cardiovascular: Denies palpitation, chest discomfort or lower extremity swelling Gastrointestinal:  Denies nausea, heartburn or change in bowel habits Skin: Denies abnormal skin rashes Lymphatics: Denies new lymphadenopathy or easy bruising Neurological:Denies numbness, tingling or new weaknesses Behavioral/Psych: Mood is stable, no new changes  All other systems were reviewed with the patient and are negative.  VITALS:   There were no vitals taken for this visit.  Wt Readings from Last 3 Encounters:  05/03/23 215 lb 6.4 oz (97.7 kg)    There is no height or weight on file to calculate BMI.  Performance status (ECOG): {CHL ONC [id number]  PHYSICAL EXAM:   GENERAL:alert, no distress and  comfortable SKIN: skin color, texture, turgor are normal, no rashes or significant lesions EYES: normal, Conjunctiva are pink and non-injected, sclera clear OROPHARYNX:no exudate, no erythema and lips, buccal mucosa, and tongue normal  NECK: supple, thyroid normal size, non-tender, without nodularity LYMPH:  no palpable lymphadenopathy in the cervical, axillary or inguinal LUNGS: clear to auscultation and percussion with normal breathing effort HEART: regular rate & rhythm and no murmurs and no lower extremity edema ABDOMEN:abdomen soft, non-tender and normal bowel sounds Musculoskeletal:no cyanosis of digits and no clubbing  NEURO: alert & oriented x 3 with fluent speech, no focal motor/sensory deficits  LABORATORY DATA:  I have reviewed the data as listed No results found for: "NA", "K", "CL", "CO2", "GLUCOSE", "BUN", "CREATININE", "CALCIUM", "PROT", "ALBUMIN", "AST", "ALT", "ALKPHOS", "BILITOT", "GFRNONAA", "GFRAA"  No results found for: "SPEP", "UPEP"  Lab Results  Component Value Date   WBC 12.9 (H) 05/03/2023   NEUTROABS 6.4 05/03/2023   HGB 15.2 (H) 05/03/2023   HCT 44.3 05/03/2023   MCV 89.1 05/03/2023   PLT 364 05/03/2023      Chemistry   No results found for: "NA", "K", "CL", "CO2", "BUN", "CREATININE", "GLU" No results found for: "CALCIUM", "ALKPHOS", "AST", "ALT", "BILITOT"     RADIOGRAPHIC STUDIES: I have personally reviewed the radiological images as listed and agreed with the findings in the report. No results found.

## 2024-04-24 ENCOUNTER — Inpatient Hospital Stay: Payer: Self-pay | Admitting: Nurse Practitioner

## 2024-04-24 ENCOUNTER — Telehealth: Payer: Self-pay

## 2024-04-24 ENCOUNTER — Inpatient Hospital Stay: Payer: Self-pay | Attending: Nurse Practitioner

## 2024-04-24 DIAGNOSIS — D72820 Lymphocytosis (symptomatic): Secondary | ICD-10-CM

## 2024-04-24 NOTE — Telephone Encounter (Signed)
 Called patient due to not showing up for her 1230 lab app and her 1300 app with Rande Bushy NP. No answer so I left a message on the VM that we would reschedule her app and today would be a no show.
